# Patient Record
Sex: Female | Born: 1995 | Race: Black or African American | Hispanic: No | Marital: Single | Smoking: Never smoker
Health system: Southern US, Community
[De-identification: ages and names within clinical notes are randomized; demographics above are authoritative.]

## PROBLEM LIST (undated history)

## (undated) DIAGNOSIS — D649 Anemia, unspecified: Secondary | ICD-10-CM

---
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
## 2015-01-02 ENCOUNTER — Encounter (HOSPITAL_COMMUNITY): Payer: Self-pay | Admitting: Emergency Medicine

## 2015-01-02 DIAGNOSIS — R51 Headache: Secondary | ICD-10-CM | POA: Insufficient documentation

## 2015-01-02 NOTE — ED Notes (Signed)
Pt presents with a headache for the past 3 days sensitive to light and sound- reports today she began to feel nauseated.  Neuro exam normal.  Pt alert and oriented X 4.

## 2015-01-03 ENCOUNTER — Emergency Department (HOSPITAL_COMMUNITY)
Admission: EM | Admit: 2015-01-03 | Discharge: 2015-01-03 | Disposition: A | Discharge status: Home / Self Care | Payer: Self-pay | Attending: Emergency Medicine | Admitting: Emergency Medicine

## 2015-01-03 DIAGNOSIS — R51 Headache: Secondary | ICD-10-CM

## 2015-01-03 DIAGNOSIS — R519 Headache, unspecified: Secondary | ICD-10-CM

## 2015-01-03 MED ORDER — METOCLOPRAMIDE HCL 5 MG/ML IJ SOLN
10.0000 mg | Freq: Once | INTRAMUSCULAR | Status: AC
  Administered 2015-01-03: 10 mg via INTRAVENOUS
  Filled 2015-01-03: qty 2

## 2015-01-03 MED ORDER — SODIUM CHLORIDE 0.9 % IV BOLUS (SEPSIS)
1000.0000 mL | Freq: Once | INTRAVENOUS | Status: AC
  Administered 2015-01-03: 1000 mL via INTRAVENOUS

## 2015-01-03 MED ORDER — IBUPROFEN 400 MG PO TABS: 400.0000 mg | ORAL_TABLET | Freq: Four times a day (QID) | ORAL | Status: DC | PRN

## 2015-01-03 MED ORDER — KETOROLAC TROMETHAMINE 30 MG/ML IJ SOLN
30.0000 mg | Freq: Once | INTRAMUSCULAR | Status: AC
  Administered 2015-01-03: 30 mg via INTRAVENOUS
  Filled 2015-01-03: qty 1

## 2015-01-03 NOTE — ED Notes (Signed)
Pt. Refused wheelchair and left with all belongings 

## 2015-01-03 NOTE — Discharge Instructions (Signed)
We saw you in the ER for headaches.  We are not sure what is causing your headaches, however, there appears to be no evidence of infection, bleeds or tumors based on our exam and results.  Please take motrin round the clock for the next 6 hours, and take other meds prescribed only for break through pain. See your doctor if the pain persists, as you might need better medications or a specialist.  RESOURCE GUIDE  Chronic Pain Problems: Contact [facility]  [phone] Patients need to be referred by their primary care doctor.  Insufficient Money for Medicine: Contact [employer]:  call "211."   No Primary Care Doctor: - Call Health Connect  [phone] - can help you locate a primary care doctor that  accepts your insurance, provides certain services, etc. - Physician Referral Service- [phone]  Agencies that provide inexpensive medical care: - [facility]  [phone] - [facility]  [phone] - [facility]  [phone] - Women's Clinic  [phone] - [facility]  [phone] [facility]  [phone]  Medicaid-accepting [facility] Providers: - [facility]- [address]  [phone], Mon-Fri 9am-7pm, Sat 9am-1pm - [facility]- [address] [place], [address] [place]  [phone] - [facility]- [address], [address] [place]  [phone] [facility]- [address]  [phone] - Renaye Rakers- [address] [place], [address], [phone]  Only accepts [place] patients after they have their name  applied to their card  Self Pay (no insurance) in [place]: - Sickle Cell Patients: Dr Willey Blade, [facility]  [address] [place], [phone] - [facility]- [address] [place]  [phone]       [facility] [place]- [address]       -     [facility]-  see information above (Speak to [employer] if you do not have insurance)       -  [facility]- [address],  [phone]       -  [facility]- [address] [phone]       -  Dr Julio Sicks-  [address], [place], [phone]       -  [facility] - [address] [phone]       -  [facility]- [address] [phone], also [address], [phone]       -    [facility]- [address] [place], [phone], 1st & 3rd Saturday        every month, 10am-1pm  [facility] [address] [place], [place] [address] [phone]  [facility] [address] [place], [place] [address] [phone]  1) Find a Doctor and Pay Out of Pocket Although you won't have to find out who is covered by your insurance plan, it is a good idea to ask around and get recommendations. You will then need to call the office and see if the doctor you have chosen will accept you as a new patient and what types of options they offer for patients who are self-pay. Some doctors offer discounts or will set up payment plans for their patients who do not  have insurance, but you will need to ask so you aren't surprised when you get to your appointment.  2) Contact Your Local Health Department Not all health departments have doctors that can see patients for sick visits, but many do, so it is worth a call to see if yours does. If you don't know where your local health department is, you can check in your phone book. The CDC also has a tool to help you locate your state's health department, and many state websites also have listings of all of their local health departments.  3) Find a Walk-in Clinic If your illness is not likely to be very severe or complicated, you may want to try a walk in clinic. These are popping up all over the country in pharmacies, drugstores, and shopping centers. They're  usually staffed by nurse practitioners or physician assistants that have been trained to treat common illnesses and complaints. They're usually fairly quick and inexpensive. However, if you have serious medical issues or chronic medical problems, these are probably not your best option  STD Testing - [facility] [place], STD Clinic, [address], [place], phone [phone] or [phone].  Monday - Friday, call for an appointment. [facility] [place] [facility] [place], STD Clinic, [place], [place], phone [phone] or [phone].  Monday - Friday, call for an appointment.  Abuse/Neglect: [facility]- [place] Child Abuse Hotline (903)692-5032(336) [phone] [facility] [place] Child Abuse Hotline (269)[phone]) 704-9966 (After Hours)  Emergency Shelter:  [facility] [phone]) [phone]  Maternity Homes: - [facility] [phone](336) [phone] - [facility] [phone]  MRSA Hotline #:   919 567 1366920-323-5213  Dental Assistance If unable to pay or uninsured, contact:  [facility] to become qualified for the adult dental clinic.  Patients with Medicaid: [facility] [phone], [phone] [address], [phone]  If unable to pay, or uninsured, contact [facility] [phone] in [place], [phone] in [place]) to become qualified for the adult dental clinic  [facility] [address] [place], [place] [address] [phone]) [phone] [email or web address]  Other [facility]: - [facility]- [address], [place], [place], [address], [phone], [address]. [phone], 2nd and 4th Thursday of the month at 6:30am.  10 clients each day by appointment, can sometimes see walk-in patients if someone does not show for an appointment. [facility]- [address], [place], [place], [address], [phone] - [facility]- [address], [place], [place], [address], [phone] [facility] [place] [facility]- [phone] [facility]- [facility]- [phone] [facility]- [phone]- [phone]          Headaches, Frequently Asked Questions MIGRAINE HEADACHES Q: What is migraine? What causes it? How can I treat it? A: Generally, migraine headaches begin as a dull ache. Then they develop into a constant, throbbing, and pulsating pain. You may experience pain at the temples. You may experience pain at the front or back of one or both sides of the head. The pain is usually accompanied by a combination of:  Nausea.  Vomiting.  Sensitivity to light and noise. Some people (about 15%) experience an aura (see below) before an attack. The cause of migraine is believed to be chemical reactions in the brain. Treatment for migraine may include over-the-counter or prescription medications. It may also include self-help techniques. These include  relaxation training and biofeedback.  Q: What is an aura? A: About 15% of people with migraine get an "aura". This is a sign of neurological symptoms that occur before a migraine headache. You may see wavy or jagged lines, dots, or flashing lights. You might experience tunnel vision or blind spots in one or both eyes. The aura can include visual or auditory hallucinations (something imagined). It may include disruptions in smell (such as strange odors), taste or touch. Other symptoms include:  Numbness.  A "pins and needles" sensation.  Difficulty in recalling or speaking the correct word. These neurological events may last as long as 60 minutes. These symptoms will fade as the headache begins. Q: What is a trigger? A: Certain physical or environmental factors can lead to or "trigger" a migraine. These include:  Foods.  Hormonal changes.  Weather.  Stress. It is important to remember that triggers are different for everyone. To help  prevent migraine attacks, you need to figure out which triggers affect you. Keep a headache diary. This is a good way to track triggers. The diary will help you talk to your healthcare professional about your condition. Q: Does weather affect migraines? A: Bright sunshine, hot, humid conditions, and drastic changes in barometric pressure may lead to, or "trigger," a migraine attack in some people. But studies have shown that weather does not act as a trigger for everyone with migraines. Q: What is the link between migraine and hormones? A: Hormones start and regulate many of your body's functions. Hormones keep your body in balance within a constantly changing environment. The levels of hormones in your body are unbalanced at times. Examples are during menstruation, pregnancy, or menopause. That can lead to a migraine attack. In fact, about three quarters of all women with migraine report that their attacks are related to the menstrual cycle.  Q: Is there an increased risk of stroke for migraine sufferers? A: The likelihood of a migraine attack causing a stroke is very remote. That is not to say that migraine sufferers cannot have a stroke associated with their migraines. In persons under age 40, the most common associated factor for stroke is migraine headache. But over the course of a person's normal life span, the occurrence of migraine headache may actually be associated with a reduced risk of dying from cerebrovascular disease due to stroke.  Q: What are acute medications for migraine? A: Acute medications are used to treat the pain of the headache after it has started. Examples over-the-counter medications, NSAIDs, ergots, and triptans.  Q: What are the triptans? A: Triptans are the newest class of abortive medications. They are specifically targeted to treat migraine. Triptans are vasoconstrictors. They moderate some chemical reactions in the brain. The triptans work on receptors in your brain.  Triptans help to restore the balance of a neurotransmitter called serotonin. Fluctuations in levels of serotonin are thought to be a main cause of migraine.  Q: Are over-the-counter medications for migraine effective? A: Over-the-counter, or "OTC," medications may be effective in relieving mild to moderate pain and associated symptoms of migraine. But you should see your caregiver before beginning any treatment regimen for migraine.  Q: What are preventive medications for migraine? A: Preventive medications for migraine are sometimes referred to as "prophylactic" treatments. They are used to reduce the frequency, severity, and length of migraine attacks. Examples of preventive medications include antiepileptic medications, antidepressants, beta-blockers, calcium channel blockers, and NSAIDs (nonsteroidal anti-inflammatory drugs). Q: Why are anticonvulsants used to treat migraine?  A: During the past few years, there has been an increased interest in antiepileptic drugs for the prevention of migraine. They are sometimes referred to as "anticonvulsants". Both epilepsy and migraine may be caused by similar reactions in the brain.  Q: Why are antidepressants used to treat migraine? A: Antidepressants are typically used to treat people with depression. They may reduce migraine frequency by regulating chemical levels, such as serotonin, in the brain.  Q: What alternative therapies are used to treat migraine? A: The term "alternative therapies" is often used to describe treatments considered outside the scope of conventional Western medicine. Examples of alternative therapy include acupuncture, acupressure, and yoga. Another common alternative treatment is herbal therapy. Some herbs are believed to relieve headache pain. Always discuss alternative therapies with your caregiver before proceeding. Some herbal products contain arsenic and other toxins. TENSION HEADACHES Q: What is a tension-type headache? What  causes it? How can I treat it? A: Tension-type headaches occur randomly. They are often the result of temporary stress, anxiety, fatigue, or anger. Symptoms include soreness in your temples, a tightening band-like sensation around your head (a "vice-like" ache). Symptoms can also include a pulling feeling, pressure sensations, and contracting head and neck muscles. The headache begins in your forehead, temples, or the back of your head and neck. Treatment for tension-type headache may include over-the-counter or prescription medications. Treatment may also include self-help techniques such as relaxation training and biofeedback. CLUSTER HEADACHES Q: What is a cluster headache? What causes it? How can I treat it? A: Cluster headache gets its name because the attacks come in groups. The pain arrives with little, if any, warning. It is usually on one side of the head. A tearing or bloodshot eye and a runny nose on the same side of the headache may also accompany the pain. Cluster headaches are believed to be caused by chemical reactions in the brain. They have been described as the most severe and intense of any headache type. Treatment for cluster headache includes prescription medication and oxygen. SINUS HEADACHES Q: What is a sinus headache? What causes it? How can I treat it? A: When a cavity in the bones of the face and skull (a sinus) becomes inflamed, the inflammation will cause localized pain. This condition is usually the result of an allergic reaction, a tumor, or an infection. If your headache is caused by a sinus blockage, such as an infection, you will probably have a fever. An x-ray will confirm a sinus blockage. Your caregiver's treatment might include antibiotics for the infection, as well as antihistamines or decongestants.  REBOUND HEADACHES Q: What is a rebound headache? What causes it? How can I treat it? A: A pattern of taking acute headache medications too often can lead to a condition  known as "rebound headache." A pattern of taking too much headache medication includes taking it more than 2 days per week or in excessive amounts. That means more than the label or a caregiver advises. With rebound headaches, your medications not only stop relieving pain, they actually begin to cause headaches. Doctors treat rebound headache by tapering the medication that is being overused. Sometimes your caregiver will gradually substitute a different type of treatment or medication. Stopping may be a challenge. Regularly overusing a medication increases the potential for serious side effects. Consult a caregiver if you regularly use headache medications more than 2 days per week or more than the label advises. ADDITIONAL QUESTIONS AND ANSWERS Q: What is biofeedback? A: Biofeedback is a  self-help treatment. Biofeedback uses special equipment to monitor your body's involuntary physical responses. Biofeedback monitors:  Breathing.  Pulse.  Heart rate.  Temperature.  Muscle tension.  Brain activity. Biofeedback helps you refine and perfect your relaxation exercises. You learn to control the physical responses that are related to stress. Once the technique has been mastered, you do not need the equipment any more. Q: Are headaches hereditary? A: Four out of five (80%) of people that suffer report a family history of migraine. Scientists are not sure if this is genetic or a family predisposition. Despite the uncertainty, a child has a 50% chance of having migraine if one parent suffers. The child has a 75% chance if both parents suffer.  Q: Can children get headaches? A: By the time they reach high school, most young people have experienced some type of headache. Many safe and effective approaches or medications can prevent a headache from occurring or stop it after it has begun.  Q: What type of doctor should I see to diagnose and treat my headache? A: Start with your primary caregiver. Discuss  his or her experience and approach to headaches. Discuss methods of classification, diagnosis, and treatment. Your caregiver may decide to recommend you to a headache specialist, depending upon your symptoms or other physical conditions. Having diabetes, allergies, etc., may require a more comprehensive and inclusive approach to your headache. The National Headache Foundation will provide, upon request, a list of [facility] physician members in your state. Document Released: 12/14/2003 Document Revised: 12/16/2011 Document Reviewed: 05/23/2008 [facility] Patient Information 2015 [place], [place]. This information is not intended to replace advice given to you by your health care provider. Make sure you discuss any questions you have with your health care provider.

## 2015-01-03 NOTE — ED Provider Notes (Signed)
CSN: [id number]     Arrival date & time 01/02/15  2346 History   None    This chart was scribed for Michele KaplanAnkit Mitchael Luckey, MD by Arlan OrganAshley Kaiser, ED Scribe. This patient was seen in room B18C/B18C and the patient's care was started 3:25 AM.   Chief Complaint  Patient presents with  . Headache   HPI  HPI Comments: Michele Kaiser is a 19 y.o. female without any pertinent past medical history who presents to the Emergency Department complaining of constant, ongoing HA x 2-3 days. Discomfort is described as throbbing and currently rated 5/10. Pain progressively worsened yesterday morning after waking from sleep. HA is exacerbated when moving around without any alleviating factors. She has tried OTC Tylenol with mild temporary improvement for symptoms. No weakness, numbness, visual changes, vomiting, nausea. Father and grandmother both have history of Migraines. Great grandmother with history of brain bleeds/aneurysms. Ms. Catha Kaiser is currently on her menstrual cycles. She denies any associated HAs when on her menstrual cycle. No known allergies to medications.  History reviewed. No pertinent past medical history. History reviewed. No pertinent past surgical history. No family history on file. History  Substance Use Topics  . Smoking status: Never Smoker   . Smokeless tobacco: Not on file  . Alcohol Use: No   OB History    No data available     Review of Systems  Constitutional: Negative for fever and chills.  Eyes: Negative for photophobia and visual disturbance.  Gastrointestinal: Negative for nausea and vomiting.  Neurological: Positive for headaches. Negative for seizures, facial asymmetry, weakness, light-headedness and numbness.      Allergies  Review of patient's allergies indicates no known allergies.  Home Medications   Prior to Admission medications   Medication Sig Start Date End Date Taking? Authorizing Provider  acetaminophen (TYLENOL) 325 MG tablet Take 650 mg by mouth every  6 (six) hours as needed for headache.   Yes Historical Provider, MD  ibuprofen (ADVIL,MOTRIN) 400 MG tablet Take 1 tablet (400 mg total) by mouth every 6 (six) hours as needed. 01/03/15   Michele KaplanAnkit Kirsi Hugh, MD   Triage Vitals: BP 121/87 mmHg  Pulse 75  Temp(Src) 98.8 F (37.1 C) (Oral)  Resp 16  Ht 5\' 1"  (1.549 m)  Wt 110 lb 4.8 oz (50.032 kg)  BMI 20.85 kg/m2  SpO2 100%  LMP 01/02/2015   Physical Exam  Constitutional: She is oriented to person, place, and time. She appears well-developed and well-nourished. No distress.  HENT:  Head: Normocephalic and atraumatic.  Eyes: EOM are normal. Right eye exhibits no nystagmus. Left eye exhibits no nystagmus.  Pupils are 5 and equal and reactive to light   Neck: Normal range of motion. Neck supple.  No meningeal signs  Cardiovascular: Normal rate, regular rhythm and normal heart sounds.   Pulmonary/Chest: Effort normal and breath sounds normal.  Lungs clear to ausculation   Abdominal: Soft. She exhibits no distension. There is no tenderness.  Musculoskeletal: Normal range of motion.  Neurological: She is alert and oriented to person, place, and time. No cranial nerve deficit. Coordination normal.  Cranial nerves 2-12 intact No meningeal signs   Skin: Skin is warm and dry.  Psychiatric: She has a normal mood and affect. Judgment normal.  Nursing note and vitals reviewed.   ED Course  Procedures (including critical care time)  DIAGNOSTIC STUDIES: Oxygen Saturation is 100% on RA, Normal by my interpretation.    COORDINATION OF CARE: 3:27 AM-Discussed treatment plan with pt at bedside  and pt agreed to plan.     Labs Review Labs Reviewed - No data to display  Imaging Review No results found.   EKG Interpretation None     :00 - headaches have resolved. Advised to see [facility] if the headaches return.  MDM   Final diagnoses:  Headache, unspecified headache type   Pt comes in with headache x 2 days. Headaches  are temporal, frontal - and rated at 5/10. No nausea, vomiting, visual complains, seizures, altered mental status, loss of consciousness, new weakness, or numbness, no gait instability - and no meningeal signs or focal neuro deficits on exam. Will give headache meds, expect her to respond to the meds and be discharged.   Michele Kaplan, MD 01/03/15 [phone]

## 2016-08-17 ENCOUNTER — Emergency Department (HOSPITAL_COMMUNITY)
Admission: EM | Admit: 2016-08-17 | Discharge: 2016-08-17 | Disposition: A | Discharge status: Home / Self Care | Payer: BLUE CROSS/BLUE SHIELD | Attending: Emergency Medicine | Admitting: Emergency Medicine

## 2016-08-17 ENCOUNTER — Encounter (HOSPITAL_COMMUNITY): Payer: Self-pay

## 2016-08-17 ENCOUNTER — Emergency Department (HOSPITAL_COMMUNITY): Payer: BLUE CROSS/BLUE SHIELD

## 2016-08-17 DIAGNOSIS — R1013 Epigastric pain: Secondary | ICD-10-CM | POA: Insufficient documentation

## 2016-08-17 LAB — CBC
HCT: 39.9 % (ref 36.0–46.0)
Hemoglobin: 13.2 g/dL (ref 12.0–15.0)
MCH: 28.1 pg (ref 26.0–34.0)
MCHC: 33.1 g/dL (ref 30.0–36.0)
MCV: 84.9 fL (ref 78.0–100.0)
Platelets: 318 10*3/uL (ref 150–400)
RBC: 4.7 MIL/uL (ref 3.87–5.11)
RDW: 13.4 % (ref 11.5–15.5)
WBC: 5.9 10*3/uL (ref 4.0–10.5)

## 2016-08-17 LAB — COMPREHENSIVE METABOLIC PANEL
ALBUMIN: 4.4 g/dL (ref 3.5–5.0)
ALT: 12 U/L — ABNORMAL LOW (ref 14–54)
ANION GAP: 8 (ref 5–15)
AST: 22 U/L (ref 15–41)
Alkaline Phosphatase: 55 U/L (ref 38–126)
BILIRUBIN TOTAL: 0.6 mg/dL (ref 0.3–1.2)
BUN: 10 mg/dL (ref 6–20)
CHLORIDE: 103 mmol/L (ref 101–111)
CO2: 29 mmol/L (ref 22–32)
Calcium: 10.4 mg/dL — ABNORMAL HIGH (ref 8.9–10.3)
Creatinine, Ser: 0.98 mg/dL (ref 0.44–1.00)
GFR calc Af Amer: >60 mL/min (ref >60)
GFR calc non Af Amer: >60 mL/min (ref >60)
GLUCOSE: 73 mg/dL (ref 65–99)
Potassium: 3.4 mmol/L — ABNORMAL LOW (ref 3.5–5.1)
SODIUM: 140 mmol/L (ref 135–145)
Total Protein: 7.3 g/dL (ref 6.5–8.1)

## 2016-08-17 LAB — LIPASE, BLOOD: LIPASE: 30 U/L (ref 11–51)

## 2016-08-17 LAB — I-STAT BETA HCG BLOOD, ED (MC, WL, AP ONLY): I-stat hCG, quantitative: <5 m[IU]/mL (ref <5)

## 2016-08-17 MED ORDER — OMEPRAZOLE 20 MG PO CPDR: 20.0000 mg | DELAYED_RELEASE_CAPSULE | Freq: Every day | ORAL | 1 refills | Status: DC

## 2016-08-17 MED ORDER — GI COCKTAIL ~~LOC~~
30.0000 mL | Freq: Once | ORAL | Status: AC
  Administered 2016-08-17: 30 mL via ORAL
  Filled 2016-08-17: qty 30

## 2016-08-17 MED ORDER — SUCRALFATE 1 G PO TABS: 1.0000 g | ORAL_TABLET | Freq: Three times a day (TID) | ORAL | 0 refills | Status: DC

## 2016-08-17 NOTE — ED Triage Notes (Signed)
Patient complains of generalized abdominal pan with worsening of pain following any intake. States that the pain has been intermittent since Tuesday with nausea. No diarrhea, no constipation.

## 2016-08-17 NOTE — Discharge Instructions (Signed)
Take prilosec daily. Carafate with meals and at bed time. Bland diet, avoid spicy or fatty foods. Follow up with family doctor if not improving. Return if worsening.

## 2016-08-17 NOTE — ED Notes (Signed)
Pt stable, understands discharge instructions, and reasons for return.   

## 2016-08-17 NOTE — ED Provider Notes (Signed)
[facility]-EMERGENCY DEPT Provider Note   CSN: [id number] Arrival date & time: 08/17/16  1524     History   Chief Complaint Chief Complaint  Patient presents with  . Abdominal Pain    HPI Michele Kaiser is a 20 y.o. female.  HPI Calen Catha Kaiser is a 20 y.o. female with no medical problems, presents to ED with epigastric pain. Patient's pain started 4 days ago. She reports pain is worsened with eating. Reports associated nausea, no vomiting. Normal bowel movements. Last bowel movement was yesterday. No blood in her stool. Denies any black or tarry stools. She has not tried any medications for this prior to coming in. She denies any similar pain in the past. She denies any prior abdominal surgeries. She denies any fever or chills. No chest pain. No back pain. No pleuritic symptoms. Denies any other associated symptoms.  History reviewed. No pertinent past medical history.  There are no active problems to display for this patient.   History reviewed. No pertinent surgical history.  OB History    No data available       Home Medications    Prior to Admission medications   Medication Sig Start Date End Date Taking? Authorizing Provider  acetaminophen (TYLENOL) 325 MG tablet Take 650 mg by mouth every 6 (six) hours as needed for headache.    Historical Provider, MD  ibuprofen (ADVIL,MOTRIN) 400 MG tablet Take 1 tablet (400 mg total) by mouth every 6 (six) hours as needed. 01/03/15   Derwood KaplanAnkit Nanavati, MD    Family History No family history on file.  Social History Social History  Substance Use Topics  . Smoking status: Never Smoker  . Smokeless tobacco: Not on file  . Alcohol use No     Allergies   Patient has no known allergies.   Review of Systems Review of Systems  Constitutional: Negative for chills and fever.  Respiratory: Negative for cough, chest tightness and shortness of breath.   Cardiovascular: Negative for chest pain, palpitations and leg swelling.    Gastrointestinal: Positive for abdominal pain and nausea. Negative for diarrhea and vomiting.  Genitourinary: Negative for dysuria, flank pain, pelvic pain, vaginal bleeding, vaginal discharge and vaginal pain.  Musculoskeletal: Negative for arthralgias, myalgias, neck pain and neck stiffness.  Skin: Negative for rash.  Neurological: Negative for dizziness, weakness and headaches.  All other systems reviewed and are negative.    Physical Exam Updated Vital Signs BP (!) 141/109 (BP Location: Left Arm)   Pulse 85   Temp 98 F (36.7 C) (Oral)   Resp 18   Ht 5\' 1"  (1.549 m)   Wt 49.4 kg   SpO2 100%   BMI 20.60 kg/m   Physical Exam  Constitutional: She appears well-developed and well-nourished. No distress.  HENT:  Head: Normocephalic.  Eyes: Conjunctivae are normal.  Neck: Neck supple.  Cardiovascular: Normal rate, regular rhythm and normal heart sounds.   Pulmonary/Chest: Effort normal and breath sounds normal. No respiratory distress. She has no wheezes. She has no rales.  Abdominal: Soft. Bowel sounds are normal. She exhibits no distension. There is tenderness. There is no rebound.  Epigastric and right upper quadrant tenderness, no guarding  Musculoskeletal: She exhibits no edema.  Neurological: She is alert.  Skin: Skin is warm and dry.  Psychiatric: She has a normal mood and affect. Her behavior is normal.  Nursing note and vitals reviewed.    ED Treatments / Results  Labs (all labs ordered are listed, but only abnormal results  are displayed) Labs Reviewed  COMPREHENSIVE METABOLIC PANEL - Abnormal; Notable for the following:       Result Value   Potassium 3.4 (*)    Calcium 10.4 (*)    ALT 12 (*)    All other components within normal limits  LIPASE, BLOOD  CBC  URINALYSIS, ROUTINE W REFLEX MICROSCOPIC (NOT AT [facility])  I-STAT BETA HCG BLOOD, ED ([facility], [facility], [facility] ONLY)    EKG  EKG Interpretation None       Radiology [place] Abdomen Complete  Result Date:  08/17/2016 CLINICAL DATA:  Acute onset of generalized abdominal pain. Initial encounter. EXAM: ABDOMEN ULTRASOUND COMPLETE COMPARISON:  None. FINDINGS: Gallbladder: No gallstones or wall thickening visualized. No sonographic Murphy sign noted by sonographer. Common bile duct: Diameter: 0.2 cm, within normal limits in caliber. Liver: No focal lesion identified. Within normal limits in parenchymal echogenicity. IVC: No abnormality visualized. Pancreas: Visualized portion unremarkable. Spleen: Size and appearance within normal limits. Right Kidney: Length: 9.2 cm. Echogenicity within normal limits. No mass or hydronephrosis visualized. Left Kidney: Length: 9.3 cm. Echogenicity within normal limits. No mass or hydronephrosis visualized. Abdominal aorta: No aneurysm visualized. Other findings: None. IMPRESSION: Unremarkable abdominal ultrasound. Electronically Signed   By: Roanna RaiderJeffery  Chang M.D.   On: 08/17/2016 17:03    Procedures Procedures (including critical care time)  Medications Ordered in ED Medications  gi cocktail (Maalox,Lidocaine,Donnatal) (30 mLs Oral Given 08/17/16 1610)     Initial Impression / Assessment and Plan / ED Course  I have reviewed the triage vital signs and the nursing notes.  Pertinent labs & imaging results that were available during my care of the patient were reviewed by me and considered in my medical decision making (see chart for details).  Clinical Course     Patient seen and examined. Patient with epigastric and right upper quadrant abdominal pain, nausea, symptoms for 4-5 days. Denies any vomiting. No fever. No changes in her bowels. Did not try to medications prior to coming in. Will try GI cocktail, labs, ultrasound to rule out cholecystitis.  5:23 PM Lab work unremarkable. Normal white blood cell count. Not pregnant. Ultrasound negative. Patient had pain relief with GI cocktail. Suspect acid reflux versus gastritis. At this time evidence of peptic ulcer  disease. Abdomen is soft, no guarding, normal stools. Home with Prilosec, Carafate, changes in diet, discussed avoiding NSAIDs and caffeine, no spicy or fatty foods, follow up with family doctor  Vitals:   08/17/16 1532 08/17/16 1533  BP: (!) 141/109   Pulse: 85   Resp: 18   Temp: 98 F (36.7 C)   TempSrc: Oral   SpO2: 100%   Weight:  49.4 kg  Height:  5\' 1"  (1.549 m)       Final Clinical Impressions(s) / ED Diagnoses   Final diagnoses:  Epigastric pain    New Prescriptions New Prescriptions   OMEPRAZOLE (PRILOSEC) 20 MG CAPSULE    Take 1 capsule (20 mg total) by mouth daily.   SUCRALFATE (CARAFATE) 1 G TABLET    Take 1 tablet (1 g total) by mouth 4 (four) times daily -  with meals and at bedtime.     Jaynie Crumbleatyana Ellice Boultinghouse, PA-C 08/17/16 1727    Pricilla LovelessScott Goldston, MD 08/17/16 1806

## 2017-09-17 ENCOUNTER — Emergency Department (HOSPITAL_COMMUNITY): Payer: BLUE CROSS/BLUE SHIELD

## 2017-09-17 ENCOUNTER — Emergency Department (HOSPITAL_COMMUNITY)
Admission: EM | Admit: 2017-09-17 | Discharge: 2017-09-17 | Disposition: A | Discharge status: Home / Self Care | Payer: BLUE CROSS/BLUE SHIELD | Attending: Emergency Medicine | Admitting: Emergency Medicine

## 2017-09-17 ENCOUNTER — Encounter (HOSPITAL_COMMUNITY): Payer: Self-pay | Admitting: *Deleted

## 2017-09-17 ENCOUNTER — Other Ambulatory Visit: Payer: Self-pay

## 2017-09-17 DIAGNOSIS — Y9389 Activity, other specified: Secondary | ICD-10-CM | POA: Insufficient documentation

## 2017-09-17 DIAGNOSIS — Y998 Other external cause status: Secondary | ICD-10-CM | POA: Insufficient documentation

## 2017-09-17 DIAGNOSIS — Y929 Unspecified place or not applicable: Secondary | ICD-10-CM | POA: Diagnosis not present

## 2017-09-17 DIAGNOSIS — M79641 Pain in right hand: Secondary | ICD-10-CM | POA: Diagnosis not present

## 2017-09-17 DIAGNOSIS — S93401A Sprain of unspecified ligament of right ankle, initial encounter: Secondary | ICD-10-CM | POA: Diagnosis not present

## 2017-09-17 DIAGNOSIS — Z79899 Other long term (current) drug therapy: Secondary | ICD-10-CM | POA: Diagnosis not present

## 2017-09-17 DIAGNOSIS — W010XXA Fall on same level from slipping, tripping and stumbling without subsequent striking against object, initial encounter: Secondary | ICD-10-CM | POA: Diagnosis not present

## 2017-09-17 DIAGNOSIS — S99911A Unspecified injury of right ankle, initial encounter: Secondary | ICD-10-CM | POA: Diagnosis present

## 2017-09-17 HISTORY — DX: Anemia, unspecified: D64.9

## 2017-09-17 MED ORDER — IBUPROFEN 600 MG PO TABS: 600.0000 mg | ORAL_TABLET | Freq: Four times a day (QID) | ORAL | 0 refills | Status: DC | PRN

## 2017-09-17 MED ORDER — IBUPROFEN 400 MG PO TABS: 600.0000 mg | ORAL_TABLET | Freq: Once | ORAL | Status: DC

## 2017-09-17 NOTE — ED Notes (Signed)
Ortho tech paged  

## 2017-09-17 NOTE — Discharge Instructions (Signed)
X-rays of hand and ankle today show no evidence of fracture. Right hand pain and likely due to muscle use from shoveling snow, should improve with ibuprofen over the next few days. Ibuprofen and Tylenol for pain. Use ankle brace and crutches, keep ankle elevated as much as possible and apply ice. If symptoms are not improving in one week please follow-up with Dr. Eulah PontMurphy with orthopedics. If you have worsening pain swelling, redness warmth fevers or chills or other concerning symptoms return to the ED sooner for reevaluation.

## 2017-09-17 NOTE — ED Notes (Signed)
Patient transported to X-ray 

## 2017-09-17 NOTE — Progress Notes (Signed)
Orthopedic Tech Progress Note Patient Details:  Michele Kaiser 04/08/1996 [id number]  Ortho Devices Type of Ortho Device: ASO, Crutches Ortho Device/Splint Location: rle Ortho Device/Splint Interventions: Application   Post Interventions Patient Tolerated: Well Instructions Provided: Care of device   Nikki DomCrawford, Raziya Aveni 09/17/2017, 11:18 AM

## 2017-09-17 NOTE — ED Provider Notes (Signed)
[name] Provider Note   CSN: [id number] Arrival date & time: 09/17/17  [id number]     History   Chief Complaint Chief Complaint  Patient presents with  . Foot Pain    HPI Michele Kaiser is a 21 y.o. female.  Michele Kaiser is a 21 y.o. Female with a history of anemia, presents complaining of right foot and ankle pain, after she rolled her ankle yesterday while walking in the snow. Patient reports she had immediate pain and swelling afterwards, denies numbness or tingling. Reports she's been walking with a limp because it hurts to bear weight on the right foot. Localizes pain primarily to the lateral malleolus, and describes it as constant and throbbing. Patient also reporting pain over the hyperthenar eminence of her right hand, with some swelling, reports she thinks she hurt her hand also shoveling snow, denies any falls. No cuts or abrasions. No fevers or chills. Able to move hand normally, no pain at the wrist.      Past Medical History:  Diagnosis Date  . Anemia     There are no active problems to display for this patient.   History reviewed. No pertinent surgical history.  OB History    No data available       Home Medications    Prior to Admission medications   Medication Sig Start Date End Date Taking? Authorizing Provider  acetaminophen (TYLENOL) 325 MG tablet Take 650 mg by mouth every 6 (six) hours as needed for headache.    [provider]  ibuprofen (ADVIL,MOTRIN) 400 MG tablet Take 1 tablet (400 mg total) by mouth every 6 (six) hours as needed. 01/03/15   Derwood KaplanNanavati, Ankit, MD  omeprazole (PRILOSEC) 20 MG capsule Take 1 capsule (20 mg total) by mouth daily. 08/17/16   Kirichenko, Tatyana, PA-C  sucralfate (CARAFATE) 1 g tablet Take 1 tablet (1 g total) by mouth 4 (four) times daily -  with meals and at bedtime. 08/17/16   Jaynie CrumbleKirichenko, Tatyana, PA-C    Family History No family history on file.  Social  History Social History   Tobacco Use  . Smoking status: Never Smoker  . Smokeless tobacco: Never Used  Substance Use Topics  . Alcohol use: No  . Drug use: No     Allergies   Patient has no known allergies.   Review of Systems Review of Systems  Constitutional: Negative for chills and fever.  Musculoskeletal: Positive for arthralgias and joint swelling.  Skin: Negative for color change, rash and wound.  Neurological: Negative for weakness and numbness.     Physical Exam Updated Vital Signs BP 138/88 (BP Location: Right Arm)   Pulse 85   Temp 98.4 F (36.9 C) (Oral)   Resp 18   Ht 5\' 4"  (1.626 m)   Wt 48.7 kg (107 lb 5 oz)   LMP 08/23/2017 (Approximate)   SpO2 100%   BMI 18.42 kg/m   Physical Exam  Constitutional: She appears well-developed and well-nourished. No distress.  HENT:  Head: Normocephalic and atraumatic.  Eyes: Right eye exhibits no discharge. Left eye exhibits no discharge.  Pulmonary/Chest: Effort normal. No respiratory distress.  Musculoskeletal:  There is swelling and tenderness over the lateral malleolus.No overt deformity. No tenderness over the medial aspect of the ankle. The fifth metatarsal is not tender. The ankle joint is intact without excessive opening on stressing. DP and TP pulses 2+ with good capillary refill, normal sensation. Pain with dorsi and plantar flexion Right hand  with minimal swelling over the hyperthenar eminence, tender to palpation, normal range of motion of all fingers, no tenderness elsewhere in the hand, no warmth or erythema, no tenderness at the wrist, normal range of motion of the wrist, 5/5 grip strength, 2+ radial pulse and good capillary refill, sensation intact  Neurological: She is alert. Coordination normal.  Skin: She is not diaphoretic.  Psychiatric: She has a normal mood and affect. Her behavior is normal.  Nursing note and vitals reviewed.    ED Treatments / Results  Labs (all labs ordered are listed,  but only abnormal results are displayed) Labs Reviewed - No data to display  EKG  EKG Interpretation None       Radiology Dg Ankle Complete Right  Result Date: 09/17/2017 CLINICAL DATA:  Pain following fall EXAM: RIGHT ANKLE - COMPLETE 3+ VIEW COMPARISON:  None. FINDINGS: Frontal, oblique, and lateral views were obtained. There is no fracture or joint effusion. Joint spaces appear normal. No erosive change. Ankle mortise appears intact. IMPRESSION: No evident fracture or arthropathy.  Ankle mortise appears intact. Electronically Signed   By: Bretta Bang III M.D.   On: 09/17/2017 08:38   Dg Hand Complete Right  Result Date: 09/17/2017 CLINICAL DATA:  Acute right hand pain and swelling without known injury. EXAM: RIGHT HAND - COMPLETE 3+ VIEW COMPARISON:  None. FINDINGS: There is no evidence of fracture or dislocation. There is no evidence of arthropathy or other focal bone abnormality. Soft tissues are unremarkable. IMPRESSION: Normal right hand. Electronically Signed   By: Lupita Raider, M.D.   On: 09/17/2017 09:57    Procedures Procedures (including critical care time)  Medications Ordered in ED Medications - No data to display   Initial Impression / Assessment and Plan / ED Course  I have reviewed the triage vital signs and the nursing notes.  Pertinent labs & imaging results that were available during my care of the patient were reviewed by me and considered in my medical decision making (see chart for details).  Presentation consistent with ankle sprain. Tenderness and swelling over lateral malleolus, pt is neurovascularly intact, ankle without excessive opening on stressing. X-ray negative for fracture, and shows ankle mortise is intact. Pain treated in the ED. Pt placed in ASO brace and provided crutches, ambulated without difficulty. Patient also complaining of right hand pain over the hyperthenar eminence, no palpable deformity, neurovascularly intact, x-ray negative  for fracture, pain likely due to muscle use from shoveling snow, should improve over the next few days with NSAIDs, pain not increased with movement of the hand or fingers do not feel like a brace would be helpful here. Pt stable for discharge home with ibuprofen for pain. Pt to follow-up with ortho in one week if symptoms not improving. Return precautions discussed, Pt expresses understanding and agrees with plan.   Final Clinical Impressions(s) / ED Diagnoses   Final diagnoses:  Sprain of right ankle, unspecified ligament, initial encounter  Right hand pain    ED Discharge Orders        Ordered    ibuprofen (ADVIL,MOTRIN) 600 MG tablet  Every 6 hours PRN     09/17/17 1006       Jodi Geralds [place], [place] 09/17/17 1857    Shaune Pollack, MD 09/17/17 2051

## 2017-09-17 NOTE — ED Triage Notes (Signed)
Pt in c/o R ankle pain onset yesterday when her foot twisted under her in the snow, pt ambulatory with pain, swelling present, no obvious deformity

## 2017-10-23 ENCOUNTER — Other Ambulatory Visit: Payer: Self-pay

## 2017-10-23 ENCOUNTER — Emergency Department (HOSPITAL_COMMUNITY)
Admission: EM | Admit: 2017-10-23 | Discharge: 2017-10-23 | Disposition: A | Discharge status: Home / Self Care | Payer: BLUE CROSS/BLUE SHIELD | Attending: Emergency Medicine | Admitting: Emergency Medicine

## 2017-10-23 ENCOUNTER — Encounter (HOSPITAL_COMMUNITY): Payer: Self-pay | Admitting: Emergency Medicine

## 2017-10-23 DIAGNOSIS — R1010 Upper abdominal pain, unspecified: Secondary | ICD-10-CM

## 2017-10-23 LAB — COMPREHENSIVE METABOLIC PANEL
ALT: 13 U/L — ABNORMAL LOW (ref 14–54)
AST: 28 U/L (ref 15–41)
Albumin: 4.6 g/dL (ref 3.5–5.0)
Alkaline Phosphatase: 55 U/L (ref 38–126)
Anion gap: 12 (ref 5–15)
BUN: 7 mg/dL (ref 6–20)
CO2: 22 mmol/L (ref 22–32)
Calcium: 9.9 mg/dL (ref 8.9–10.3)
Chloride: 103 mmol/L (ref 101–111)
Creatinine, Ser: 1.12 mg/dL — ABNORMAL HIGH (ref 0.44–1.00)
GFR calc non Af Amer: >60 mL/min (ref >60)
Glucose, Bld: 97 mg/dL (ref 65–99)
Potassium: 4 mmol/L (ref 3.5–5.1)
Sodium: 137 mmol/L (ref 135–145)
TOTAL PROTEIN: 8.1 g/dL (ref 6.5–8.1)
Total Bilirubin: 0.9 mg/dL (ref 0.3–1.2)

## 2017-10-23 LAB — CBC
HEMATOCRIT: 41.5 % (ref 36.0–46.0)
Hemoglobin: 13.5 g/dL (ref 12.0–15.0)
MCH: 27.7 pg (ref 26.0–34.0)
MCHC: 32.5 g/dL (ref 30.0–36.0)
MCV: 85.2 fL (ref 78.0–100.0)
PLATELETS: 327 10*3/uL (ref 150–400)
RBC: 4.87 MIL/uL (ref 3.87–5.11)
RDW: 13.3 % (ref 11.5–15.5)
WBC: 4.4 10*3/uL (ref 4.0–10.5)

## 2017-10-23 LAB — URINALYSIS, ROUTINE W REFLEX MICROSCOPIC
Bilirubin Urine: NEGATIVE
GLUCOSE, UA: NEGATIVE mg/dL
Hgb urine dipstick: NEGATIVE
Ketones, ur: 15 mg/dL — AB
LEUKOCYTES UA: NEGATIVE
Nitrite: NEGATIVE
PROTEIN: NEGATIVE mg/dL
Specific Gravity, Urine: 1.01 (ref 1.005–1.030)
pH: 5.5 (ref 5.0–8.0)

## 2017-10-23 LAB — I-STAT BETA HCG BLOOD, ED (MC, WL, AP ONLY): I-stat hCG, quantitative: <5 m[IU]/mL (ref <5)

## 2017-10-23 LAB — LIPASE, BLOOD: LIPASE: 29 U/L (ref 11–51)

## 2017-10-23 MED ORDER — GI COCKTAIL ~~LOC~~
30.0000 mL | Freq: Once | ORAL | Status: AC
  Administered 2017-10-23: 30 mL via ORAL
  Filled 2017-10-23: qty 30

## 2017-10-23 MED ORDER — OMEPRAZOLE 20 MG PO CPDR: 20.0000 mg | DELAYED_RELEASE_CAPSULE | Freq: Two times a day (BID) | ORAL | 1 refills | Status: DC

## 2017-10-23 MED ORDER — SUCRALFATE 1 G PO TABS: 1.0000 g | ORAL_TABLET | Freq: Three times a day (TID) | ORAL | 0 refills | Status: DC

## 2017-10-23 NOTE — Discharge Instructions (Signed)
Please take prilosec 30 minutes before each major meal.  Take carafate with meal and at bedtime.  Return if you develop fever, vomit up blood, having productive cough or if you have other concerns.

## 2017-10-23 NOTE — ED Triage Notes (Signed)
Pt reports upper abd pain, describes as "crampy, sharp, since 2300 last night.  Pt denies n/v, LBM yesterday.

## 2017-10-23 NOTE — ED Provider Notes (Signed)
[name] EMERGENCY DEPARTMENT Provider Note   CSN: [id number] Arrival date & time: 10/23/17  0807     History   Chief Complaint Chief Complaint  Patient presents with  . Abdominal Pain    HPI Michele Kaiser is a 22 y.o. female.  HPI   22 year old female with history of anemia presenting for evaluation of abdominal pain patient reports since 9 PM last night she has had persistent upper abdominal pain.  She described pain as a sharp nonradiating sensation, 6 out of 10, keeping up from sleep.  Pain is not associated with fever, chills, lightheadedness, dizziness, chest pain, shortness of breath, productive cough, hemoptysis, back pain, nausea, vomiting, diarrhea or dysuria.  She denies any history of heartburn.  Last menstrual period was December 19.  She is not sexually active.  No urinary complaint.  No specific treatment tried.  She has never had this kind of pain before.  Past Medical History:  Diagnosis Date  . Anemia     There are no active problems to display for this patient.   History reviewed. No pertinent surgical history.  OB History    No data available       Home Medications    Prior to Admission medications   Medication Sig Start Date End Date Taking? Authorizing Provider  acetaminophen (TYLENOL) 325 MG tablet Take 650 mg by mouth every 6 (six) hours as needed for headache.    [provider]  ibuprofen (ADVIL,MOTRIN) 400 MG tablet Take 1 tablet (400 mg total) by mouth every 6 (six) hours as needed. 01/03/15   Derwood Kaplan, MD  ibuprofen (ADVIL,MOTRIN) 600 MG tablet Take 1 tablet (600 mg total) by mouth every 6 (six) hours as needed. 09/17/17   Dartha Lodge, PA-C  omeprazole (PRILOSEC) 20 MG capsule Take 1 capsule (20 mg total) by mouth daily. 08/17/16   Kirichenko, Tatyana, PA-C  sucralfate (CARAFATE) 1 g tablet Take 1 tablet (1 g total) by mouth 4 (four) times daily -  with meals and at bedtime. 08/17/16   Jaynie Crumble, PA-C    Family History No family history on file.  Social History Social History   Tobacco Use  . Smoking status: Never Smoker  . Smokeless tobacco: Never Used  Substance Use Topics  . Alcohol use: No  . Drug use: No     Allergies   Patient has no known allergies.   Review of Systems Review of Systems  All other systems reviewed and are negative.    Physical Exam Updated Vital Signs BP (!) 136/101 (BP Location: Left Arm)   Pulse 85   Temp 98.4 F (36.9 C) (Oral)   Resp 16   LMP 09/24/2017 (Exact Date)   SpO2 98%   Physical Exam  Constitutional: She appears well-developed and well-nourished. No distress.  HENT:  Head: Atraumatic.  Eyes: Conjunctivae are normal.  Neck: Neck supple.  Cardiovascular: Normal rate and regular rhythm.  Pulmonary/Chest: Effort normal and breath sounds normal.  Abdominal: Soft. Normal appearance. There is tenderness in the epigastric area. There is no tenderness at McBurney's point and negative Murphy's sign. Hernia confirmed negative in the right inguinal area and confirmed negative in the left inguinal area.  Neurological: She is alert.  Skin: No rash noted.  Psychiatric: She has a normal mood and affect.  Nursing note and vitals reviewed.    ED Treatments / Results  Labs (all labs ordered are listed, but only abnormal results are displayed) Labs Reviewed  COMPREHENSIVE METABOLIC PANEL - Abnormal; Notable for the following components:      Result Value   Creatinine, Ser 1.12 (*)    ALT 13 (*)    All other components within normal limits  URINALYSIS, ROUTINE W REFLEX MICROSCOPIC - Abnormal; Notable for the following components:   Ketones, ur 15 (*)    All other components within normal limits  LIPASE, BLOOD  CBC  I-STAT BETA HCG BLOOD, ED ([facility], [facility], [facility] ONLY)    EKG  EKG Interpretation None       Radiology No results found.  Procedures Procedures (including critical care time)  Medications Ordered in  ED Medications  gi cocktail (Maalox,Lidocaine,Donnatal) (30 mLs Oral Given 10/23/17 1224)     Initial Impression / Assessment and Plan / ED Course  I have reviewed the triage vital signs and the nursing notes.  Pertinent labs & imaging results that were available during my care of the patient were reviewed by me and considered in my medical decision making (see chart for details).     BP (!) 136/101 (BP Location: Left Arm)   Pulse 85   Temp 98.4 F (36.9 C) (Oral)   Resp 16   LMP 09/24/2017 (Exact Date)   SpO2 98%    Final Clinical Impressions(s) / ED Diagnoses   Final diagnoses:  Upper abdominal pain    ED Discharge Orders        Ordered    omeprazole (PRILOSEC) 20 MG capsule  2 times daily before meals     10/23/17 1304    sucralfate (CARAFATE) 1 g tablet  3 times daily with meals & bedtime     10/23/17 1304     11:44 AM Patient here with pain to the upper abdomen since last night.  Pain is reproducible on exam.  She is well-appearing with stable normal vital sign.  Her pregnancy test is negative, urine shows no signs of urinary tract infection, normal lipase, normal electrolytes panel including renal function panel and hepatic function panel, normal WBC, normal H&H.  1:06 PM Suspect gastritis causing patient's symptoms.  Low suspicion for acute abdominal pathology.  I discussed with patient option of further evaluation which may include abdominal and pelvis CT scan however due to the risk and benefit profile, patient denied agrees advanced imaging not indicated at this time.  She understands to return if her condition worsen.  She had a follow-up given.  Will prescribe Prilosec and Carafate for symptomatic treatment.  Low suspicion for cardiopulmonary etiology or gallbladder etiology.   Fayrene Helperran, Lynna Zamorano, PA-C 10/23/17 1308    Alvira MondaySchlossman, Erin, MD 10/23/17 [phone]

## 2018-01-23 ENCOUNTER — Encounter (HOSPITAL_COMMUNITY): Payer: Self-pay | Admitting: Emergency Medicine

## 2018-01-23 ENCOUNTER — Ambulatory Visit (HOSPITAL_COMMUNITY)
Admission: EM | Admit: 2018-01-23 | Discharge: 2018-01-23 | Disposition: A | Discharge status: Home / Self Care | Payer: BLUE CROSS/BLUE SHIELD | Attending: Internal Medicine | Admitting: Internal Medicine

## 2018-01-23 DIAGNOSIS — R112 Nausea with vomiting, unspecified: Secondary | ICD-10-CM

## 2018-01-23 DIAGNOSIS — J069 Acute upper respiratory infection, unspecified: Secondary | ICD-10-CM

## 2018-01-23 DIAGNOSIS — R197 Diarrhea, unspecified: Secondary | ICD-10-CM | POA: Diagnosis not present

## 2018-01-23 DIAGNOSIS — J029 Acute pharyngitis, unspecified: Secondary | ICD-10-CM

## 2018-01-23 MED ORDER — ONDANSETRON HCL 4 MG PO TABS: 4.0000 mg | ORAL_TABLET | Freq: Four times a day (QID) | ORAL | 0 refills | Status: DC

## 2018-01-23 MED ORDER — ONDANSETRON 4 MG PO TBDP
ORAL_TABLET | ORAL | Status: AC
  Filled 2018-01-23: qty 1

## 2018-01-23 MED ORDER — ONDANSETRON 4 MG PO TBDP
4.0000 mg | ORAL_TABLET | Freq: Once | ORAL | Status: AC
  Administered 2018-01-23: 4 mg via ORAL

## 2018-01-23 NOTE — Discharge Instructions (Signed)
Get plenty of rest and push fluids Use OTC throat lozenges, antihistamines, flonase, and/ or mucinex for symptomatic relief of sore throat, runny nose, and sinus congestion Zofran prescribed 30 minutes after taking nausea medicine, begin with sips of clear liquids. If able to hold down 2 - 4 ounces for 30 minutes, begin drinking more. Increase your fluid intake to replace losses. Clear liquids only for 24 hours (water, tea, sport drinks, clear flat ginger ale or cola and juices, broth, jello, popsicles, ect). Advance to bland foods, applesauce, rice, baked or boiled chicken, ect. Avoid milk, greasy foods and anything that doesn?t agree with you. If vomiting persists and you are unable to hold fluids, return here or go to the ER. If diarrhea persists more than 4 days, or becomes bloody, or if you develop high fever or abdominal pain, return here, see your doctor or go to the ER.

## 2018-01-23 NOTE — ED Triage Notes (Signed)
Pt states she had a sore throat yesterday and today shes been vomiting non stop, denies abdominal pain.

## 2018-01-23 NOTE — ED Provider Notes (Signed)
[facility]    CSN: [id number] Arrival date & time: 01/23/18  1448     History   Chief Complaint Chief Complaint  Patient presents with  . Emesis    HPI Michele Kaiser is a 22 y.o. female that complains of vomiting since 7 am this morning.  She denies a precipitating event, she denies anyone round her with similar symptoms.  She tried OTC medication without relief.  Worse with eating, states she is tolerating fluids without difficulty.  She denies similar symptoms in the past.    Patient also complains of sore throat that began yesterday.  She denies any sick contacts.  She has tried cough drops without relief.  Her symptoms are worse with swallowing.  She reports similar symptoms in the past that resolved with rest.    HPI  Past Medical History:  Diagnosis Date  . Anemia     There are no active problems to display for this patient.   History reviewed. No pertinent surgical history.  OB History   None      Home Medications    Prior to Admission medications   Medication Sig Start Date End Date Taking? Authorizing Provider  ferrous sulfate 325 (65 FE) MG tablet Take 325 mg by mouth daily with breakfast.    [provider]  omeprazole (PRILOSEC) 20 MG capsule Take 1 capsule (20 mg total) by mouth 2 (two) times daily before a meal. 10/23/17   Fayrene Helper, PA-C  ondansetron (ZOFRAN) 4 MG tablet Take 1 tablet (4 mg total) by mouth every 6 (six) hours. 01/23/18   Sosaia Pittinger, [place], PA-C  sucralfate (CARAFATE) 1 g tablet Take 1 tablet (1 g total) by mouth 4 (four) times daily -  with meals and at bedtime. 10/23/17   Fayrene Helper, PA-C    Family History No family history on file.  Social History Social History   Tobacco Use  . Smoking status: Never Smoker  . Smokeless tobacco: Never Used  Substance Use Topics  . Alcohol use: No  . Drug use: No     Allergies   Patient has no known allergies.   Review of Systems Review of Systems    Constitutional: Positive for appetite change and chills. Negative for fatigue and fever.  HENT: Positive for congestion, rhinorrhea and sore throat. Negative for ear pain, sinus pressure and sinus pain.   Respiratory: Positive for cough and wheezing. Negative for shortness of breath.   Cardiovascular: Negative for chest pain.  Gastrointestinal: Positive for diarrhea, nausea and vomiting. Negative for abdominal pain and constipation.  Genitourinary: Negative for difficulty urinating and dysuria.  Musculoskeletal: Negative for myalgias.  Neurological: Positive for headaches.     Physical Exam Triage Vital Signs ED Triage Vitals [01/23/18 1540]  Enc Vitals Group     BP 120/80     Pulse Rate 98     Resp 18     Temp 98.9 F (37.2 C)     Temp src      SpO2 100 %     Weight      Height      Head Circumference      Peak Flow      Pain Score 0     Pain Loc      Pain Edu?      Excl. in GC?    No data found.  Updated Vital Signs BP 120/80   Pulse 98   Temp 98.9 F (37.2 C)   Resp 18  LMP 01/05/2018   SpO2 100%   Physical Exam  Constitutional: She is oriented to person, place, and time. She appears well-developed and well-nourished. No distress.  HENT:  Head: Normocephalic and atraumatic.  Right Ear: External ear normal.  Left Ear: External ear normal.  Nose: Nose normal.  Mouth/Throat: Oropharynx is clear and moist. No oropharyngeal exudate.  Eyes: Pupils are equal, round, and reactive to light. Conjunctivae and EOM are normal.  Neck: Normal range of motion. Neck supple.  Cardiovascular: Normal rate and regular rhythm. Exam reveals no friction rub.  No murmur heard. Radial pulse 2+ bilaterally  Pulmonary/Chest: Effort normal and breath sounds normal. No stridor. No respiratory distress. She has no wheezes. She has no rales.  CTA bilaterally  Abdominal: Soft. Bowel sounds are normal. She exhibits no distension. There is no tenderness. There is no guarding.   Lymphadenopathy:    She has cervical adenopathy.  Neurological: She is alert and oriented to person, place, and time.  Skin: Skin is warm and dry. Capillary refill takes less than 2 seconds. She is not diaphoretic.  Psychiatric: She has a normal mood and affect. Her behavior is normal. Judgment and thought content normal.  Vitals reviewed.    UC Treatments / Results  Labs (all labs ordered are listed, but only abnormal results are displayed) Labs Reviewed - No data to display  EKG None Radiology No results found.  Procedures Procedures (including critical care time)  Medications Ordered in UC Medications  ondansetron (ZOFRAN-ODT) disintegrating tablet 4 mg (4 mg Oral Given 01/23/18 1545)     Initial Impression / Assessment and Plan / UC Course  I have reviewed the triage vital signs and the nursing notes.  Pertinent labs & imaging results that were available during my care of the patient were reviewed by me and considered in my medical decision making (see chart for details).     Patient presents with nausea, vomiting and diarrhea for one day.  She is tolerating fluids and carbonated beverages without difficulty, but is unable to tolerate foods.  On PE she does not appear tachycardic, dehydrated or in distress.  She denies any abdominal tenderness on exam.  Given zofran in office. States symptoms improved. Prescribed zofran and given diet restrictions for 24 hours.  Will advance diet as instructed and tolerated.    Patient also states she has sore throat, rhinorrhea, and sinus congestion.  PE is unremarkable.  Symptoms and PE suggest viral etiology vs. Bacterial.  Will continue OTC medication as needed.    Return and ER precautions given.   Final Clinical Impressions(s) / UC Diagnoses   Final diagnoses:  Nausea vomiting and diarrhea  Upper respiratory infection, viral  Viral pharyngitis    ED Discharge Orders        Ordered    ondansetron (ZOFRAN) 4 MG tablet   Every 6 hours     01/23/18 1706       Controlled Substance Prescriptions [place] Controlled Substance Registry consulted? Not Applicable   Rennis HardingWurst, Alizabeth Antonio, [place] JerseyPA-C 01/23/18 1719

## 2019-02-24 ENCOUNTER — Emergency Department (HOSPITAL_COMMUNITY): Payer: BLUE CROSS/BLUE SHIELD

## 2019-02-24 ENCOUNTER — Encounter (HOSPITAL_COMMUNITY): Payer: Self-pay | Admitting: *Deleted

## 2019-02-24 ENCOUNTER — Emergency Department (HOSPITAL_COMMUNITY)
Admission: EM | Admit: 2019-02-24 | Discharge: 2019-02-24 | Disposition: A | Discharge status: Home / Self Care | Payer: BLUE CROSS/BLUE SHIELD | Attending: Emergency Medicine | Admitting: Emergency Medicine

## 2019-02-24 ENCOUNTER — Other Ambulatory Visit: Payer: Self-pay

## 2019-02-24 DIAGNOSIS — S8002XA Contusion of left knee, initial encounter: Secondary | ICD-10-CM | POA: Diagnosis not present

## 2019-02-24 DIAGNOSIS — T07XXXA Unspecified multiple injuries, initial encounter: Secondary | ICD-10-CM | POA: Diagnosis present

## 2019-02-24 DIAGNOSIS — S8001XA Contusion of right knee, initial encounter: Secondary | ICD-10-CM | POA: Insufficient documentation

## 2019-02-24 DIAGNOSIS — Y999 Unspecified external cause status: Secondary | ICD-10-CM | POA: Insufficient documentation

## 2019-02-24 DIAGNOSIS — Y939 Activity, unspecified: Secondary | ICD-10-CM | POA: Insufficient documentation

## 2019-02-24 DIAGNOSIS — W2210XA Striking against or struck by unspecified automobile airbag, initial encounter: Secondary | ICD-10-CM | POA: Diagnosis not present

## 2019-02-24 DIAGNOSIS — M546 Pain in thoracic spine: Secondary | ICD-10-CM | POA: Insufficient documentation

## 2019-02-24 DIAGNOSIS — Y929 Unspecified place or not applicable: Secondary | ICD-10-CM | POA: Insufficient documentation

## 2019-02-24 DIAGNOSIS — S00212A Abrasion of left eyelid and periocular area, initial encounter: Secondary | ICD-10-CM | POA: Diagnosis not present

## 2019-02-24 DIAGNOSIS — S0990XA Unspecified injury of head, initial encounter: Secondary | ICD-10-CM | POA: Diagnosis not present

## 2019-02-24 DIAGNOSIS — S8990XA Unspecified injury of unspecified lower leg, initial encounter: Secondary | ICD-10-CM

## 2019-02-24 MED ORDER — LIDOCAINE 5 % EX PTCH: 1.0000 | MEDICATED_PATCH | CUTANEOUS | 0 refills | Status: DC

## 2019-02-24 NOTE — Discharge Instructions (Signed)
Take Tylenol as needed for pain.  Ice affected knees.  Use lidocaine patch as needed for pain of the back.  Follow-up with your primary care provider in 2 days for continued evaluation.  Return to the ED immediately for new or worsening symptoms or concerns, such as passing out, seizure-like activity, chest pain, shortness of breath, vomiting or any concerns at all.
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
## 2019-02-24 NOTE — ED Notes (Signed)
Patient verbalizes understanding of discharge instructions . Opportunity for questions and answers were provided . Armband removed by staff ,Pt discharged from ED. W/C  offered at D/C  and Declined W/C at D/C and was escorted to lobby by RN.  
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
## 2022-03-19 ENCOUNTER — Ambulatory Visit (HOSPITAL_COMMUNITY)
Admission: EM | Admit: 2022-03-19 | Discharge: 2022-03-19 | Disposition: A | Discharge status: Home / Self Care | Payer: BC Managed Care – PPO | Attending: Family Medicine | Admitting: Family Medicine

## 2022-03-19 ENCOUNTER — Encounter (HOSPITAL_COMMUNITY): Payer: Self-pay | Admitting: Emergency Medicine

## 2022-03-19 ENCOUNTER — Other Ambulatory Visit: Payer: Self-pay

## 2022-03-19 DIAGNOSIS — H109 Unspecified conjunctivitis: Secondary | ICD-10-CM

## 2022-03-19 MED ORDER — OLOPATADINE HCL 0.2 % OP SOLN: 1.0000 [drp] | Freq: Every day | OPHTHALMIC | 0 refills | Status: DC

## 2022-03-19 MED ORDER — TOBRAMYCIN 0.3 % OP SOLN: 1.0000 [drp] | Freq: Four times a day (QID) | OPHTHALMIC | 0 refills | Status: DC

## 2022-03-19 NOTE — ED Triage Notes (Addendum)
Left eye tearing, swollen, red, and painful.  Onset yesterday.  Patient does have runny nose, cough.  Patient has a slight sore throat.  Patient does not wear contacts, does wear glasses.  Left eye is not nearly as bad as left eye, but is tearing and has discolored discharge at corner of right eye   Patient tried allergy eye drops and warm cloth last night

## 2022-03-19 NOTE — ED Provider Notes (Signed)
  [facility]   [id number] 03/19/22 Arrival Time: 4650  ASSESSMENT & PLAN:  1. Conjunctivitis of both eyes, unspecified conjunctivitis type    Begin: Meds ordered this encounter  Medications   Olopatadine HCl 0.2 % SOLN    Sig: Apply 1 drop to eye daily.    Dispense:  2.5 mL    Refill:  0   tobramycin (TOBREX) 0.3 % ophthalmic solution    Sig: Place 1 drop into both eyes every 6 (six) hours.    Dispense:  5 mL    Refill:  0    Warm compress to eye(s). Local eye care discussed. Analgesics as needed.  Reviewed expectations re: course of current medical issues. Questions answered. Outlined signs and symptoms indicating need for more acute intervention. Patient verbalized understanding. After Visit Summary given.   SUBJECTIVE:  Michele Kaiser is a 26 y.o. female who presents with complaint of initially left eye redness/irritation; no with right eye involvement. With watery drainage; matted in am. Onset gradual, 24-48 h ago. Eye injury: denied. Visual changes: denied. Contact lens use: denied. Recent illness: mild runny nose. Self treatment: OTC gtts and warm cloth; mild help. Mild ST today. Afebrile.  OBJECTIVE:  Vitals:   03/19/22 1001  BP: (!) 156/96  Pulse: 88  Resp: 18  Temp: 98.3 F (36.8 C)  TempSrc: Oral  SpO2: 98%    General appearance: alert; no distress HEENT: [place]; AT; PERRLA; no restriction of the extraocular movements; throat with mild cobblestoning OS: without reported pain; with 2+ conjunctival injection; with watery drainage; without gross corneal opacities; without limbal flush; with slight infraorbital swelling without erythema OD: without reported pain; with 1+ medial conjunctival injection; with watery drainage; without gross corneal opacities; without limbal flush; without periorbital swelling or erythema Neck: supple without LAD Lungs: clear to auscultation bilaterally; unlabored respirations Heart: regular rate and rhythm Skin:  warm and dry Psychological: alert and cooperative; normal mood and affect    No Known Allergies  Past Medical History:  Diagnosis Date   Anemia    Social History   Socioeconomic History   Marital status: Single    Spouse name: Not on file   Number of children: Not on file   Years of education: Not on file   Highest education level: Not on file  Occupational History   Not on file  Tobacco Use   Smoking status: Never   Smokeless tobacco: Never  Vaping Use   Vaping Use: Never used  Substance and Sexual Activity   Alcohol use: No   Drug use: No   Sexual activity: Not on file  Other Topics Concern   Not on file  Social History Narrative   Not on file   Social Determinants of Health   Financial Resource Strain: Not on file  Food Insecurity: Not on file  Transportation Needs: Not on file  Physical Activity: Not on file  Stress: Not on file  Social Connections: Not on file  Intimate Partner Violence: Not on file   Family History  Problem Relation Age of Onset   Healthy Mother    Healthy Father    History reviewed. No pertinent surgical history.    Mardella Layman, MD 03/19/22 1343

## 2023-02-01 ENCOUNTER — Encounter (HOSPITAL_COMMUNITY): Payer: Self-pay | Admitting: Emergency Medicine

## 2023-02-01 ENCOUNTER — Ambulatory Visit (HOSPITAL_COMMUNITY)
Admission: EM | Admit: 2023-02-01 | Discharge: 2023-02-01 | Disposition: A | Discharge status: Home / Self Care | Payer: BC Managed Care – PPO | Attending: Physician Assistant | Admitting: Physician Assistant

## 2023-02-01 DIAGNOSIS — J020 Streptococcal pharyngitis: Secondary | ICD-10-CM | POA: Diagnosis not present

## 2023-02-01 LAB — POCT RAPID STREP A (OFFICE): Rapid Strep A Screen: POSITIVE — AB

## 2023-02-01 MED ORDER — AMOXICILLIN 500 MG PO CAPS: 500.0000 mg | ORAL_CAPSULE | Freq: Two times a day (BID) | ORAL | 0 refills | Status: DC

## 2023-02-01 MED ORDER — IBUPROFEN 800 MG PO TABS
ORAL_TABLET | ORAL | Status: AC
  Filled 2023-02-01: qty 1

## 2023-02-01 MED ORDER — IBUPROFEN 800 MG PO TABS
800.0000 mg | ORAL_TABLET | Freq: Once | ORAL | Status: AC
  Administered 2023-02-01: 800 mg via ORAL

## 2023-02-01 NOTE — ED Triage Notes (Signed)
Pt c/o sore throat since Thursday. Took Jerilynn Feldmeier's cough drops, and chloraseptic spray without relief.

## 2023-02-01 NOTE — Discharge Instructions (Signed)
You have strep throat.  Please take the amoxicillin as directed.  Complete the entire course of antibiotics.  You may take Tylenol or ibuprofen as needed for pain.  Throat lozenges and salt water gargles may help.  Return to care if worse or not improving of symptoms.

## 2023-02-01 NOTE — ED Provider Notes (Signed)
[facility]   MRN: [id number] DOB: April 20, 1996  Subjective:   Michele Kaiser is a 27 y.o. female presenting for sore throat symptoms x 2 days.  States that the pain has been worsening since then.  Denies any fever or chills.  It does hurt to swallow, but she does not have any difficulty swallowing.  She has been taking Hall's cough drops and Chloraseptic throat spray without much relief.  She has not taken any Tylenol or ibuprofen today.  She has been around a small child with recent illness, but not sure the kind of sickness.  Denies any other symptoms.  No current facility-administered medications for this encounter.  Current Outpatient Medications:    amoxicillin (AMOXIL) 500 MG capsule, Take 1 capsule (500 mg total) by mouth 2 (two) times daily for 10 days., Disp: 20 capsule, Rfl: 0   lidocaine (LIDODERM) 5 %, Place 1 patch onto the skin daily. Remove & Discard patch within 12 hours or as directed by MD (Patient not taking: Reported on 03/19/2022), Disp: 30 patch, Rfl: 0   loratadine (CLARITIN) 10 MG tablet, Take 10 mg by mouth daily., Disp: , Rfl:    Olopatadine HCl 0.2 % SOLN, Apply 1 drop to eye daily., Disp: 2.5 mL, Rfl: 0   omeprazole (PRILOSEC) 20 MG capsule, Take 1 capsule (20 mg total) by mouth 2 (two) times daily before a meal. (Patient not taking: Reported on 02/24/2019), Disp: 30 capsule, Rfl: 1   sucralfate (CARAFATE) 1 g tablet, Take 1 tablet (1 g total) by mouth 4 (four) times daily -  with meals and at bedtime. (Patient not taking: Reported on 02/24/2019), Disp: 30 tablet, Rfl: 0   tobramycin (TOBREX) 0.3 % ophthalmic solution, Place 1 drop into both eyes every 6 (six) hours., Disp: 5 mL, Rfl: 0   No Known Allergies  Past Medical History:  Diagnosis Date   Anemia      History reviewed. No pertinent surgical history.  Family History  Problem Relation Age of Onset   Healthy Mother    Healthy Father     Social History   Tobacco Use   Smoking  status: Never   Smokeless tobacco: Never  Vaping Use   Vaping Use: Never used  Substance Use Topics   Alcohol use: No   Drug use: No    ROS REFER TO HPI FOR PERTINENT POSITIVES AND NEGATIVES   Objective:   Vitals: BP 127/87 (BP Location: Right Arm)   Pulse 76   Temp 99.4 F (37.4 C) (Oral)   Resp 15   LMP 01/31/2023   SpO2 98%   Physical Exam Vitals and nursing note reviewed.  Constitutional:      General: She is not in acute distress.    Appearance: Normal appearance. She is not ill-appearing.  HENT:     Head: Normocephalic.     Right Ear: Tympanic membrane, ear canal and external ear normal.     Left Ear: Tympanic membrane, ear canal and external ear normal.     Nose: No congestion or rhinorrhea.     Mouth/Throat:     Mouth: Mucous membranes are moist.     Pharynx: Posterior oropharyngeal erythema present. No oropharyngeal exudate.     Tonsils: Tonsillar exudate present. No tonsillar abscesses. 3+ on the right. 3+ on the left.  Eyes:     Extraocular Movements: Extraocular movements intact.     Conjunctiva/sclera: Conjunctivae normal.     Pupils: Pupils are equal, round, and  reactive to light.  Cardiovascular:     Rate and Rhythm: Normal rate and regular rhythm.     Pulses: Normal pulses.     Heart sounds: Normal heart sounds. No murmur heard. Pulmonary:     Effort: Pulmonary effort is normal. No respiratory distress.     Breath sounds: Normal breath sounds. No wheezing.  Musculoskeletal:     Cervical back: Normal range of motion.  Skin:    General: Skin is warm.  Neurological:     Mental Status: She is alert and oriented to person, place, and time.  Psychiatric:        Mood and Affect: Mood normal.        Behavior: Behavior normal.     Results for orders placed or performed during the hospital encounter of 02/01/23 (from the past 24 hour(s))  POC rapid strep A     Status: Abnormal   Collection Time: 02/01/23  4:51 PM  Result Value Ref Range   Rapid  Strep A Screen Positive (A) Negative    Assessment and Plan :   PDMP not reviewed this encounter.  1. Strep pharyngitis    Will treat for strep pharyngitis.  Patient is to start amoxicillin 500 mg bid x 10 days, use supportive care otherwise. Counseled patient on potential for adverse effects with medications prescribed/recommended today, ER and return-to-clinic precautions discussed, patient verbalized understanding.     AllwardtCrist Infante, PA-C 02/01/23 1657

## 2023-02-03 ENCOUNTER — Encounter (HOSPITAL_COMMUNITY): Payer: Self-pay

## 2023-02-03 ENCOUNTER — Ambulatory Visit (HOSPITAL_COMMUNITY)
Admission: EM | Admit: 2023-02-03 | Discharge: 2023-02-03 | Disposition: A | Discharge status: Home / Self Care | Payer: BC Managed Care – PPO | Attending: Emergency Medicine | Admitting: Emergency Medicine

## 2023-02-03 DIAGNOSIS — T360X5A Adverse effect of penicillins, initial encounter: Secondary | ICD-10-CM | POA: Diagnosis not present

## 2023-02-03 DIAGNOSIS — L270 Generalized skin eruption due to drugs and medicaments taken internally: Secondary | ICD-10-CM

## 2023-02-03 MED ORDER — CEFDINIR 300 MG PO CAPS: 300.0000 mg | ORAL_CAPSULE | Freq: Two times a day (BID) | ORAL | 0 refills | Status: AC

## 2023-02-03 NOTE — ED Provider Notes (Signed)
[facility]    CSN: [id number] Arrival date & time: 02/03/23  1203      History   Chief Complaint Chief Complaint  Patient presents with   Rash    HPI Michele Kaiser is a 27 y.o. female.  Here with rash that started yesterday. 2 days ago she was started on amoxicillin for a positive strep test. She has taken amoxicillin in the past without any issues Throat did start to improve today Reports rash on hands, arms, and legs. Denies shortness of breath or any swelling.  Denies new soaps/lotions/detergents. Only recent change is the amox  Past Medical History:  Diagnosis Date   Anemia     There are no problems to display for this patient.   History reviewed. No pertinent surgical history.  OB History   No obstetric history on file.      Home Medications    Prior to Admission medications   Medication Sig Start Date End Date Taking? Authorizing Provider  cefdinir (OMNICEF) 300 MG capsule Take 1 capsule (300 mg total) by mouth 2 (two) times daily for 10 days. 02/03/23 02/13/23 Yes Mahalie Kanner, Lurena Joiner, PA-C  loratadine (CLARITIN) 10 MG tablet Take 10 mg by mouth daily.    [provider]    Family History Family History  Problem Relation Age of Onset   Healthy Mother    Healthy Father     Social History Social History   Tobacco Use   Smoking status: Never   Smokeless tobacco: Never  Vaping Use   Vaping Use: Never used  Substance Use Topics   Alcohol use: No   Drug use: No     Allergies   Amoxicillin   Review of Systems Review of Systems As per HPI  Physical Exam Triage Vital Signs ED Triage Vitals  Enc Vitals Group     BP 02/03/23 1345 (!) 138/90     Pulse Rate 02/03/23 1345 66     Resp 02/03/23 1345 14     Temp 02/03/23 1345 98.9 F (37.2 C)     Temp Source 02/03/23 1345 Oral     SpO2 02/03/23 1345 97 %     Weight --      Height --      Head Circumference --      Peak Flow --      Pain Score 02/03/23 1347 0     Pain  Loc --      Pain Edu? --      Excl. in GC? --    No data found.  Updated Vital Signs BP (!) 138/90 (BP Location: Left Arm)   Pulse 66   Temp 98.9 F (37.2 C) (Oral)   Resp 14   LMP 01/31/2023   SpO2 97%   Physical Exam Vitals and nursing note reviewed.  Constitutional:      General: She is not in acute distress.    Appearance: She is not ill-appearing.  HENT:     Mouth/Throat:     Mouth: Mucous membranes are moist.     Pharynx: Oropharynx is clear. Posterior oropharyngeal erythema present.     Comments: No swelling of lips or tongue.  Tolerating secretions, normal phonation Cardiovascular:     Rate and Rhythm: Normal rate and regular rhythm.     Heart sounds: Normal heart sounds.  Pulmonary:     Effort: Pulmonary effort is normal.     Breath sounds: Normal breath sounds.     Comments: Clear lungs Abdominal:  Tenderness: There is no abdominal tenderness.  Musculoskeletal:     Cervical back: Normal range of motion.  Skin:    General: Skin is warm and dry.     Comments: Bilateral forearms have a couple maculopapular lesions. The rash is not splotchy. No wheels or urticaria.  Neurological:     Mental Status: She is alert and oriented to person, place, and time.     UC Treatments / Results  Labs (all labs ordered are listed, but only abnormal results are displayed) Labs Reviewed - No data to display  EKG  Radiology No results found.  Procedures Procedures (including critical care time)  Medications Ordered in UC Medications - No data to display  Initial Impression / Assessment and Plan / UC Course  I have reviewed the triage vital signs and the nursing notes.  Pertinent labs & imaging results that were available during my care of the patient were reviewed by me and considered in my medical decision making (see chart for details).  Rash is not presenting as the "classic" amoxicillin rash although the antibiotic is her only recent change. She has no other  symptoms. Clear lungs on exam, no swelling, afebrile. Will discontinue the amox and start cefdinir BID x 10. Advised to change toothbrush. Return precautions discussed  Final Clinical Impressions(s) / UC Diagnoses   Final diagnoses:  Amoxicillin-induced allergic rash     Discharge Instructions      Please stop taking the amoxicillin.  The rash should go away once you stop this. Instead take the cefdinir twice daily for 10 days. Change your toothbrush in the next day or so to avoid reinfection Please return with any concerns     ED Prescriptions     Medication Sig Dispense Auth. Provider   cefdinir (OMNICEF) 300 MG capsule Take 1 capsule (300 mg total) by mouth 2 (two) times daily for 10 days. 20 capsule Laray Rivkin, Lurena Joiner, PA-C      PDMP not reviewed this encounter.   Marlow Baars, [place] 02/03/23 1418

## 2023-02-03 NOTE — Discharge Instructions (Signed)
Please stop taking the amoxicillin.  The rash should go away once you stop this. Instead take the cefdinir twice daily for 10 days. Change your toothbrush in the next day or so to avoid reinfection Please return with any concerns

## 2023-02-03 NOTE — ED Triage Notes (Signed)
Patient reports that she was prescribed Amoxicillin for a positive strep test 2 days ago. Patient states she developed a rash after taking the Amoxicillin.  Patient states she has not had any medication for the allergic reaction.
